# Patient Record
Sex: Male | Born: 1985 | Hispanic: Yes | Marital: Single | Smoking: Current some day smoker
Health system: Southern US, Community
[De-identification: ages and names within clinical notes are randomized; demographics above are authoritative.]

## PROBLEM LIST (undated history)

## (undated) DIAGNOSIS — J45909 Unspecified asthma, uncomplicated: Secondary | ICD-10-CM

## (undated) HISTORY — PX: SHOULDER SURGERY: SHX246

---

## 2013-10-22 ENCOUNTER — Emergency Department (HOSPITAL_COMMUNITY): Payer: No Typology Code available for payment source

## 2013-10-22 ENCOUNTER — Emergency Department (HOSPITAL_COMMUNITY)
Admission: EM | Admit: 2013-10-22 | Discharge: 2013-10-22 | Disposition: A | Discharge status: Home / Self Care | Payer: No Typology Code available for payment source | Attending: Emergency Medicine | Admitting: Emergency Medicine

## 2013-10-22 ENCOUNTER — Encounter (HOSPITAL_COMMUNITY): Payer: Self-pay | Admitting: Emergency Medicine

## 2013-10-22 DIAGNOSIS — M25512 Pain in left shoulder: Secondary | ICD-10-CM

## 2013-10-22 DIAGNOSIS — S4980XA Other specified injuries of shoulder and upper arm, unspecified arm, initial encounter: Secondary | ICD-10-CM | POA: Insufficient documentation

## 2013-10-22 DIAGNOSIS — Z79899 Other long term (current) drug therapy: Secondary | ICD-10-CM | POA: Insufficient documentation

## 2013-10-22 DIAGNOSIS — S46909A Unspecified injury of unspecified muscle, fascia and tendon at shoulder and upper arm level, unspecified arm, initial encounter: Secondary | ICD-10-CM | POA: Insufficient documentation

## 2013-10-22 DIAGNOSIS — Z880 Allergy status to penicillin: Secondary | ICD-10-CM | POA: Insufficient documentation

## 2013-10-22 DIAGNOSIS — F17200 Nicotine dependence, unspecified, uncomplicated: Secondary | ICD-10-CM | POA: Insufficient documentation

## 2013-10-22 DIAGNOSIS — Y9389 Activity, other specified: Secondary | ICD-10-CM | POA: Insufficient documentation

## 2013-10-22 DIAGNOSIS — F10929 Alcohol use, unspecified with intoxication, unspecified: Secondary | ICD-10-CM

## 2013-10-22 DIAGNOSIS — J45909 Unspecified asthma, uncomplicated: Secondary | ICD-10-CM | POA: Insufficient documentation

## 2013-10-22 DIAGNOSIS — Y92410 Unspecified street and highway as the place of occurrence of the external cause: Secondary | ICD-10-CM | POA: Insufficient documentation

## 2013-10-22 DIAGNOSIS — F1010 Alcohol abuse, uncomplicated: Secondary | ICD-10-CM | POA: Insufficient documentation

## 2013-10-22 DIAGNOSIS — Z9889 Other specified postprocedural states: Secondary | ICD-10-CM | POA: Insufficient documentation

## 2013-10-22 DIAGNOSIS — IMO0002 Reserved for concepts with insufficient information to code with codable children: Secondary | ICD-10-CM | POA: Insufficient documentation

## 2013-10-22 HISTORY — DX: Unspecified asthma, uncomplicated: J45.909

## 2013-10-22 LAB — ETHANOL: ALCOHOL ETHYL (B): 211 mg/dL — AB (ref 0–11)

## 2013-10-22 LAB — I-STAT CHEM 8, ED
BUN: 16 mg/dL (ref 6–23)
Calcium, Ion: 1.19 mmol/L (ref 1.12–1.23)
Chloride: 103 mEq/L (ref 96–112)
Creatinine, Ser: 1.4 mg/dL — ABNORMAL HIGH (ref 0.50–1.35)
GLUCOSE: 107 mg/dL — AB (ref 70–99)
HEMATOCRIT: 45 % (ref 39.0–52.0)
HEMOGLOBIN: 15.3 g/dL (ref 13.0–17.0)
Potassium: 3.4 mEq/L — ABNORMAL LOW (ref 3.7–5.3)
SODIUM: 144 meq/L (ref 137–147)
TCO2: 25 mmol/L (ref 0–100)

## 2013-10-22 LAB — CBC
HCT: 41.7 % (ref 39.0–52.0)
Hemoglobin: 14.5 g/dL (ref 13.0–17.0)
MCH: 30 pg (ref 26.0–34.0)
MCHC: 34.8 g/dL (ref 30.0–36.0)
MCV: 86.3 fL (ref 78.0–100.0)
Platelets: 249 10*3/uL (ref 150–400)
RBC: 4.83 MIL/uL (ref 4.22–5.81)
RDW: 12.7 % (ref 11.5–15.5)
WBC: 5.6 10*3/uL (ref 4.0–10.5)

## 2013-10-22 MED ORDER — CYCLOBENZAPRINE HCL 10 MG PO TABS: 10.0000 mg | ORAL_TABLET | Freq: Two times a day (BID) | ORAL | Status: AC | PRN

## 2013-10-22 MED ORDER — NAPROXEN 375 MG PO TABS: 375.0000 mg | ORAL_TABLET | Freq: Two times a day (BID) | ORAL | Status: AC

## 2013-10-22 MED ORDER — SODIUM CHLORIDE 0.9 % IV SOLN: INTRAVENOUS | Status: DC

## 2013-10-22 MED ORDER — HYDROCODONE-ACETAMINOPHEN 5-325 MG PO TABS: 1.0000 | ORAL_TABLET | ORAL | Status: AC | PRN

## 2013-10-22 MED ORDER — IOHEXOL 300 MG/ML  SOLN
100.0000 mL | Freq: Once | INTRAMUSCULAR | Status: AC | PRN
  Administered 2013-10-22: 100 mL via INTRAVENOUS

## 2013-10-22 NOTE — ED Provider Notes (Signed)
Medical screening examination/treatment/procedure(s) were performed by non-physician practitioner and as supervising physician I was immediately available for consultation/collaboration.   EKG Interpretation None       Sunnie NielsenBrian Aditri Louischarles, MD 10/22/13 [phone]

## 2013-10-22 NOTE — ED Notes (Signed)
Pt returned from CT via stretcher.
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
## 2013-10-22 NOTE — ED Provider Notes (Signed)
Medical screening examination/treatment/procedure(s) were performed by non-physician practitioner and as supervising physician I was immediately available for consultation/collaboration.   EKG Interpretation None       Sunnie NielsenBrian Blanca Thornton, MD 10/22/13 2351
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
